# Patient Record
Sex: Male | Born: 1955 | Race: White | Hispanic: No | Smoking: Never smoker
Health system: Southern US, Community
[De-identification: ages and names within clinical notes are randomized; demographics above are authoritative.]

## PROBLEM LIST (undated history)

## (undated) DIAGNOSIS — I10 Essential (primary) hypertension: Secondary | ICD-10-CM

## (undated) DIAGNOSIS — K219 Gastro-esophageal reflux disease without esophagitis: Secondary | ICD-10-CM

## (undated) HISTORY — PX: FOOT SURGERY: SHX648

## (undated) HISTORY — DX: Gastro-esophageal reflux disease without esophagitis: K21.9

## (undated) HISTORY — DX: Essential (primary) hypertension: I10

---

## 1998-03-11 ENCOUNTER — Emergency Department (HOSPITAL_COMMUNITY): Admission: EM | Admit: 1998-03-11 | Discharge: 1998-03-11 | Payer: Self-pay | Admitting: Emergency Medicine

## 2005-09-26 ENCOUNTER — Emergency Department: Payer: Self-pay | Admitting: Emergency Medicine

## 2005-09-26 ENCOUNTER — Other Ambulatory Visit: Payer: Self-pay

## 2005-09-26 IMAGING — CR DG CHEST 1V PORT
1 series · 1 of 1 positions shown · non-contrast
Comparison: none

REASON FOR EXAM: Chest pain
COMMENTS:

PROCEDURE:     DXR - DXR PORTABLE CHEST SINGLE VIEW  - [DATE]  [DATE]
RESULT:     The lung fields are clear. The heart, mediastinal and osseous
structures show no significant abnormalities. Monitoring electrodes are
present.

[view not recorded]
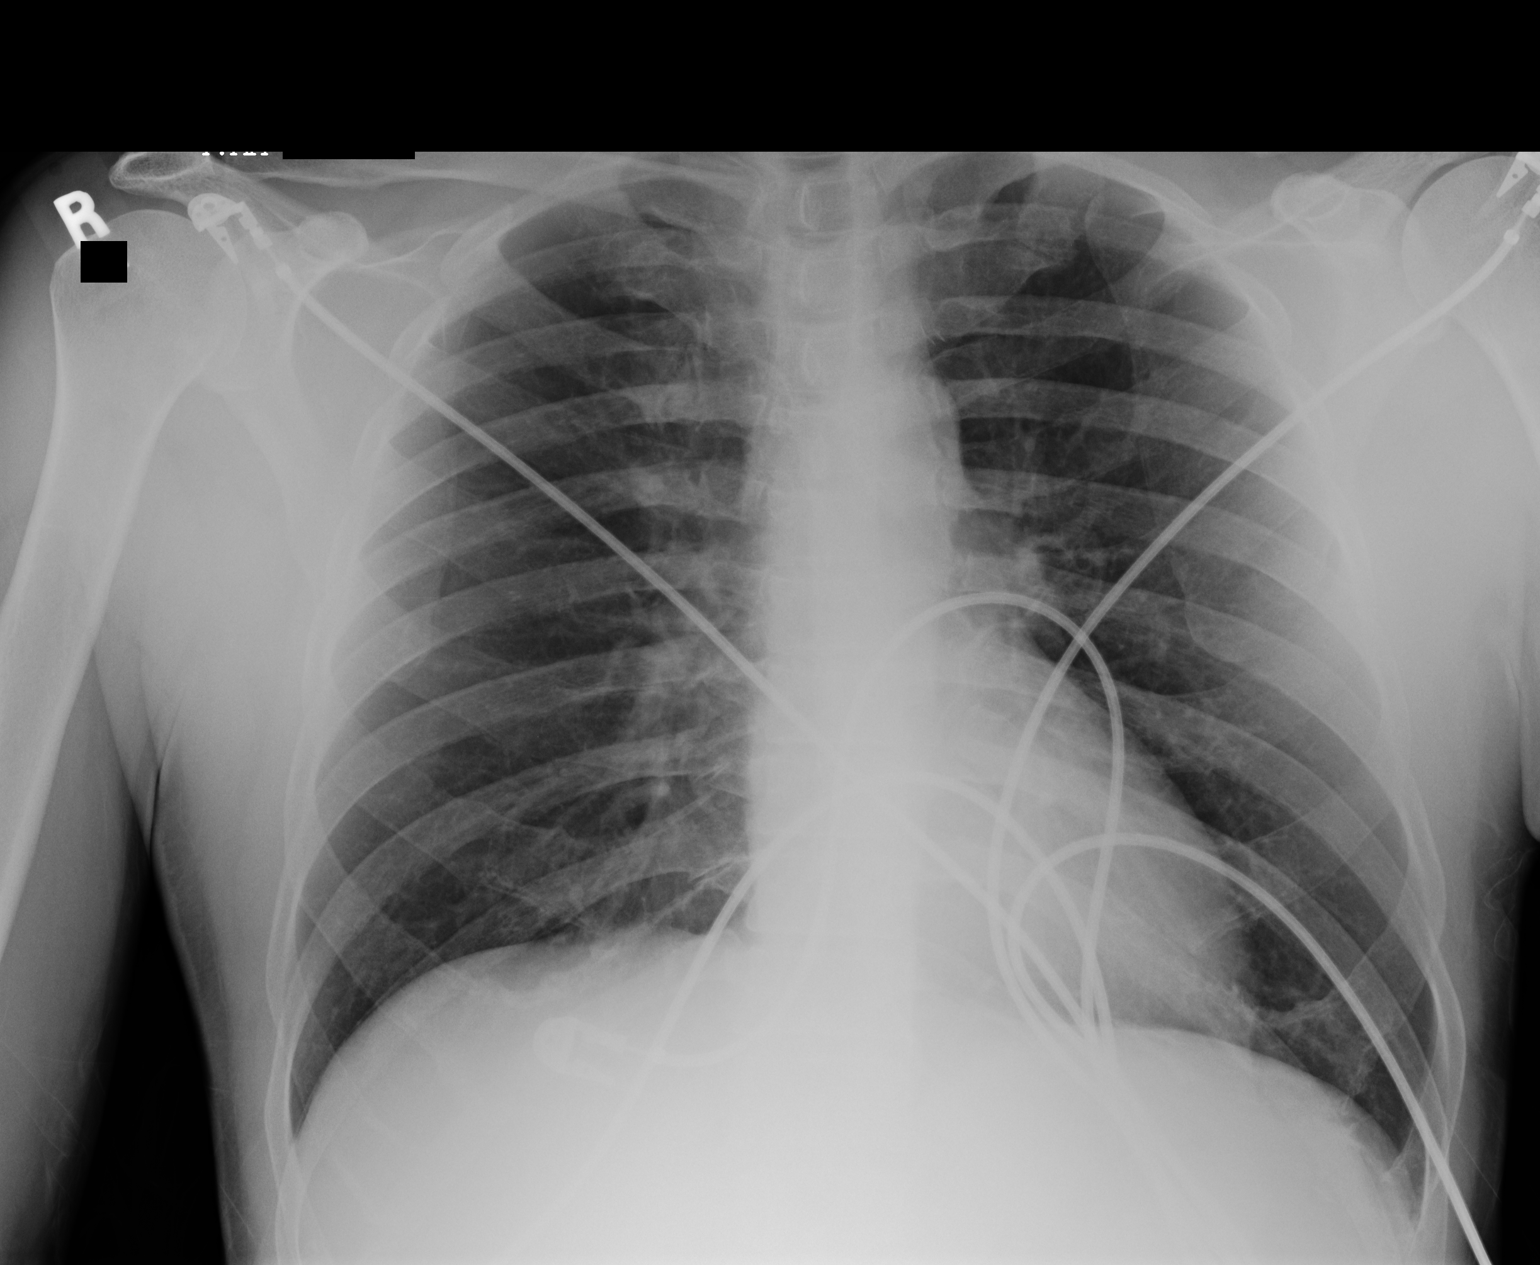

[1 of 1 positions shown; findings below may reference images not displayed]

IMPRESSION: No acute changes are identified.

## 2008-03-01 ENCOUNTER — Ambulatory Visit: Payer: Self-pay | Admitting: Unknown Physician Specialty

## 2011-03-15 ENCOUNTER — Ambulatory Visit: Payer: Self-pay | Admitting: Internal Medicine

## 2013-05-31 DIAGNOSIS — R7989 Other specified abnormal findings of blood chemistry: Secondary | ICD-10-CM | POA: Insufficient documentation

## 2013-05-31 DIAGNOSIS — I10 Essential (primary) hypertension: Secondary | ICD-10-CM | POA: Insufficient documentation

## 2013-05-31 DIAGNOSIS — R945 Abnormal results of liver function studies: Secondary | ICD-10-CM | POA: Insufficient documentation

## 2013-05-31 DIAGNOSIS — E785 Hyperlipidemia, unspecified: Secondary | ICD-10-CM | POA: Insufficient documentation

## 2013-05-31 DIAGNOSIS — K589 Irritable bowel syndrome without diarrhea: Secondary | ICD-10-CM | POA: Insufficient documentation

## 2013-05-31 DIAGNOSIS — K219 Gastro-esophageal reflux disease without esophagitis: Secondary | ICD-10-CM | POA: Insufficient documentation

## 2013-05-31 DIAGNOSIS — K7010 Alcoholic hepatitis without ascites: Secondary | ICD-10-CM | POA: Insufficient documentation

## 2015-02-02 DIAGNOSIS — R972 Elevated prostate specific antigen [PSA]: Secondary | ICD-10-CM | POA: Insufficient documentation

## 2015-02-03 ENCOUNTER — Encounter: Payer: Self-pay | Admitting: Internal Medicine

## 2015-02-07 ENCOUNTER — Ambulatory Visit (INDEPENDENT_AMBULATORY_CARE_PROVIDER_SITE_OTHER): Payer: PRIVATE HEALTH INSURANCE | Admitting: Urology

## 2015-02-07 ENCOUNTER — Encounter: Payer: Self-pay | Admitting: Urology

## 2015-02-07 VITALS — BP 164/85 | HR 79 | Ht 64.0 in | Wt 154.0 lb

## 2015-02-07 DIAGNOSIS — R351 Nocturia: Secondary | ICD-10-CM

## 2015-02-07 DIAGNOSIS — R972 Elevated prostate specific antigen [PSA]: Secondary | ICD-10-CM

## 2015-02-07 LAB — URINALYSIS, COMPLETE
Bilirubin, UA: NEGATIVE
GLUCOSE, UA: NEGATIVE
Ketones, UA: NEGATIVE
Leukocytes, UA: NEGATIVE
NITRITE UA: NEGATIVE
PH UA: 7 (ref 5.0–7.5)
Protein, UA: NEGATIVE
RBC, UA: NEGATIVE
Specific Gravity, UA: 1.015 (ref 1.005–1.030)
UUROB: 0.2 mg/dL (ref 0.2–1.0)

## 2015-02-07 LAB — MICROSCOPIC EXAMINATION
BACTERIA UA: NONE SEEN
EPITHELIAL CELLS (NON RENAL): NONE SEEN /HPF (ref 0–10)
RBC MICROSCOPIC, UA: NONE SEEN /HPF (ref 0–?)
WBC UA: NONE SEEN /HPF (ref 0–?)

## 2015-02-07 NOTE — Addendum Note (Signed)
Addended by: Wilson Singer on: 02/07/2015 10:03 AM   Modules accepted: Orders

## 2015-02-07 NOTE — Progress Notes (Signed)
02/07/2015 9:10 AM   Steve Reynolds [birth year] [id number]  Referring provider: No referring provider defined for this encounter.  Chief Complaint  Patient presents with  . Elevated PSA    New Patient    HPI: 60 year old male who is referred by Dr. Fulton Reek, M.D. for evaluation and management of an elevated PSA.   The patient's PSA was noted to be elevated as part of a routine annual physical exam. The patient denies any progressive voiding symptoms. He denies any gross hematuria or dysuria. He has no history of urinary tract infections. He has no family history of prostate cancer. PSA history:  6.69 on 01/28/15 4.12 on 10/07/14 IP SS: 2, Q O L 0  Shim: 25   PMH: Past Medical History  Diagnosis Date  . GERD (gastroesophageal reflux disease)   . Hypertension     Surgical History: Past Surgical History  Procedure Laterality Date  . Foot surgery      Home Medications:    Medication List       This list is accurate as of: 02/07/15  9:10 AM.  Always use your most recent med list.               amLODipine 5 MG tablet  Commonly known as:  NORVASC     aspirin EC 81 MG tablet  Take by mouth.     magnesium oxide 400 MG tablet  Commonly known as:  MAG-OX  Take by mouth.        Allergies: No Known Allergies  Family History: Family History  Problem Relation Age of Onset  . Prostate cancer Neg Hx   . Kidney cancer Neg Hx   . Bladder Cancer Neg Hx   . Hypertension Mother   . Colon cancer Mother     Social History:  reports that he has never smoked. He does not have any smokeless tobacco history on file. He reports that he drinks about 2.4 - 3.0 oz of alcohol per week. He reports that he does not use illicit drugs.  ROS: UROLOGY Frequent Urination?: No Hard to postpone urination?: No Burning/pain with urination?: No Get up at night to urinate?: Yes Leakage of urine?: No Urine stream starts and stops?: No Trouble starting stream?: No Do you  have to strain to urinate?: No Blood in urine?: No Urinary tract infection?: No Sexually transmitted disease?: No Injury to kidneys or bladder?: No Painful intercourse?: No Weak stream?: No Erection problems?: No Penile pain?: No  Gastrointestinal Nausea?: No Vomiting?: No Indigestion/heartburn?: No Diarrhea?: No Constipation?: No  Constitutional Fever: No Night sweats?: No Weight loss?: No Fatigue?: No  Skin Skin rash/lesions?: No Itching?: No  Eyes Blurred vision?: No Double vision?: No  Ears/Nose/Throat Sore throat?: No Sinus problems?: No  Hematologic/Lymphatic Swollen glands?: No Easy bruising?: No  Cardiovascular Leg swelling?: No Chest pain?: No  Respiratory Cough?: No Shortness of breath?: No  Endocrine Excessive thirst?: No  Musculoskeletal Back pain?: No Joint pain?: No  Neurological Headaches?: No Dizziness?: No  Psychologic Depression?: No Anxiety?: No  Physical Exam: BP 164/85 mmHg  Pulse 79  Ht 5\' 4"  (1.626 m)  Wt 69.854 kg (154 lb)  BMI 26.42 kg/m2  Constitutional:  Alert and oriented, No acute distress. HEENT: [place] AT, moist mucus membranes.  Trachea midline, no masses. Cardiovascular: No clubbing, cyanosis, or edema. Respiratory: Normal respiratory effort, no increased work of breathing. GI: Abdomen is soft, nontender, nondistended, no abdominal masses GU: No CVA tenderness.  Rectal exam:  The patient prostate is plus 2 in size, smooth, symmetric, no nodules  Skin: No rashes, bruises or suspicious lesions. Lymph: No cervical or inguinal adenopathy. Neurologic: Grossly intact, no focal deficits, moving all 4 extremities. Psychiatric: Normal mood and affect.  Laboratory Data: No results found for: WBC, HGB, HCT, MCV, PLT  No results found for: CREATININE  No results found for: PSA  No results found for: TESTOSTERONE  No results found for: HGBA1C  Urinalysis No results found for: COLORURINE, APPEARANCEUR, LABSPEC,  PHURINE, GLUCOSEU, HGBUR, BILIRUBINUR, KETONESUR, PROTEINUR, UROBILINOGEN, NITRITE, LEUKOCYTESUR  Pertinent Imaging: None  Assessment & Plan:  The patient has an elevated PSA, this trended up on repeat test. 1. Elevated PSA I discussed with the patient the implications of an elevated PSA. Using the prostate cancer prevention trial nomogram for prostate cancer the patient has a less than 10% chance of having high-grade prostate cancer. However, given the patient's age and otherwise great health, we discussed pursuing a prostate cancer diagnosis. However, the patient would like to repeat a PSA in 6 weeks. This is completely reasonable. If at that point, the patient continues to have an elevated PSA, we would likely proceed with prostate biopsy. I discussed this with the patient quite some detail. He will follow up with me in 6 weeks with a PSA prior for ongoing discussion and further management. - PSA, total and free     Return in about 1 month (around 03/10/2015).  Ardis Hughs, [facility] [address] [place], [place] [address] [phone]

## 2015-02-24 ENCOUNTER — Other Ambulatory Visit: Payer: PRIVATE HEALTH INSURANCE

## 2015-02-24 DIAGNOSIS — R972 Elevated prostate specific antigen [PSA]: Secondary | ICD-10-CM

## 2015-02-25 LAB — PSA, TOTAL AND FREE
PROSTATE SPECIFIC AG, SERUM: 4.1 ng/mL — AB (ref 0.0–4.0)
PSA FREE PCT: 16.1 %
PSA FREE: 0.66 ng/mL

## 2015-02-28 ENCOUNTER — Other Ambulatory Visit: Payer: PRIVATE HEALTH INSURANCE

## 2015-02-28 ENCOUNTER — Telehealth: Payer: Self-pay

## 2015-02-28 NOTE — Telephone Encounter (Signed)
LMOM

## 2015-02-28 NOTE — Telephone Encounter (Signed)
-----   Message from Ardis Hughs, MD sent at 02/27/2015  5:19 PM EST ----- Regarding: PSA Please give results to patient - his PSA has come back down, but remains above normal.  We can discuss implications of this at next follow-up visit.

## 2015-03-01 NOTE — Telephone Encounter (Signed)
Spoke with pt in reference to PSA results. Pt voiced understanding.  

## 2015-03-02 ENCOUNTER — Other Ambulatory Visit: Payer: PRIVATE HEALTH INSURANCE

## 2015-03-07 ENCOUNTER — Other Ambulatory Visit: Payer: Self-pay | Admitting: Urology

## 2015-03-07 ENCOUNTER — Ambulatory Visit (INDEPENDENT_AMBULATORY_CARE_PROVIDER_SITE_OTHER): Payer: PRIVATE HEALTH INSURANCE | Admitting: Urology

## 2015-03-07 ENCOUNTER — Encounter: Payer: Self-pay | Admitting: Urology

## 2015-03-07 VITALS — BP 160/85 | HR 76 | Ht 64.5 in | Wt 158.4 lb

## 2015-03-07 DIAGNOSIS — R972 Elevated prostate specific antigen [PSA]: Secondary | ICD-10-CM

## 2015-03-07 NOTE — Progress Notes (Signed)
   03/07/2015 9:18 AM   Steve Reynolds February 24, 1955 [id number]  Referring provider: Idelle Crouch, MD [facility] [place], [place] [address]  Chief Complaint  Patient presents with  . Elevated PSA    discuss PSA results    HPI: Steve Reynolds  Is a 60yo here for followup for elevated PSA. He denies any significant LUTS. No issues with ED. No family hx of prostate cancer.  Repeat PSA was 4.1 with 16% free which is down from 6.7.   PMH: Past Medical History  Diagnosis Date  . GERD (gastroesophageal reflux disease)   . Hypertension     Surgical History: Past Surgical History  Procedure Laterality Date  . Foot surgery      Home Medications:    Medication List       This list is accurate as of: 03/07/15  9:18 AM.  Always use your most recent med list.               amLODipine 5 MG tablet  Commonly known as:  NORVASC     aspirin EC 81 MG tablet  Take by mouth.     magnesium oxide 400 MG tablet  Commonly known as:  MAG-OX  Take by mouth.        Allergies: No Known Allergies  Family History: Family History  Problem Relation Age of Onset  . Prostate cancer Neg Hx   . Kidney cancer Neg Hx   . Bladder Cancer Neg Hx   . Hypertension Mother   . Colon cancer Mother     Social History:  reports that he has never smoked. He does not have any smokeless tobacco history on file. He reports that he drinks about 2.4 - 3.0 oz of alcohol per week. He reports that he does not use illicit drugs.  ROS:                                        Physical Exam: BP 160/85 mmHg  Pulse 76  Ht 5' 4.5" (1.638 m)  Wt 71.85 kg (158 lb 6.4 oz)  BMI 26.78 kg/m2  Constitutional:  Alert and oriented, No acute distress. HEENT: [place] AT, moist mucus membranes.  Trachea midline, no masses. Cardiovascular: No clubbing, cyanosis, or edema. Respiratory: Normal respiratory effort, no increased work of breathing. GI: Abdomen is soft,  nontender, nondistended, no abdominal masses GU: No CVA tenderness.  Skin: No rashes, bruises or suspicious lesions. Lymph: No cervical or inguinal adenopathy. Neurologic: Grossly intact, no focal deficits, moving all 4 extremities. Psychiatric: Normal mood and affect.  Laboratory Data: No results found for: WBC, HGB, HCT, MCV, PLT  No results found for: CREATININE  No results found for: PSA  No results found for: TESTOSTERONE  No results found for: HGBA1C  Urinalysis    Component Value Date/Time   GLUCOSEU Negative 02/07/2015 0818   BILIRUBINUR Negative 02/07/2015 0818   NITRITE Negative 02/07/2015 0818   LEUKOCYTESUR Negative 02/07/2015 0818    Pertinent Imaging: none  Assessment & Plan:    1. Elevated PSA -RTC 6 months with PSA  There are no diagnoses linked to this encounter.  No Follow-up on file.  Steve Reynolds, [facility] [address], [place], [place] [address] [phone]

## 2015-03-10 DIAGNOSIS — R972 Elevated prostate specific antigen [PSA]: Secondary | ICD-10-CM | POA: Insufficient documentation

## 2015-09-12 ENCOUNTER — Encounter: Payer: Self-pay | Admitting: Urology

## 2015-09-12 ENCOUNTER — Ambulatory Visit: Payer: PRIVATE HEALTH INSURANCE | Admitting: Urology

## 2017-11-20 ENCOUNTER — Other Ambulatory Visit: Payer: Self-pay | Admitting: Urology

## 2017-11-20 DIAGNOSIS — C61 Malignant neoplasm of prostate: Secondary | ICD-10-CM

## 2017-12-03 ENCOUNTER — Ambulatory Visit
Admission: RE | Admit: 2017-12-03 | Discharge: 2017-12-03 | Disposition: A | Discharge status: Home / Self Care | Payer: PRIVATE HEALTH INSURANCE | Source: Ambulatory Visit | Attending: Urology | Admitting: Urology

## 2017-12-03 DIAGNOSIS — C61 Malignant neoplasm of prostate: Secondary | ICD-10-CM

## 2017-12-29 ENCOUNTER — Other Ambulatory Visit: Payer: PRIVATE HEALTH INSURANCE

## 2018-01-05 ENCOUNTER — Ambulatory Visit
Admission: RE | Admit: 2018-01-05 | Discharge: 2018-01-05 | Disposition: A | Discharge status: Home / Self Care | Payer: PRIVATE HEALTH INSURANCE | Source: Ambulatory Visit | Attending: Urology | Admitting: Urology

## 2018-01-05 DIAGNOSIS — C61 Malignant neoplasm of prostate: Secondary | ICD-10-CM

## 2018-01-05 MED ORDER — GADOBENATE DIMEGLUMINE 529 MG/ML IV SOLN
14.0000 mL | Freq: Once | INTRAVENOUS | Status: AC | PRN
  Administered 2018-01-05: 14 mL via INTRAVENOUS
